# Patient Record
Sex: Female | Born: 1965 | Hispanic: Yes | Smoking: Never smoker
Health system: Southern US, Community
[De-identification: ages and names within clinical notes are randomized; demographics above are authoritative.]

## PROBLEM LIST (undated history)

## (undated) DIAGNOSIS — D219 Benign neoplasm of connective and other soft tissue, unspecified: Secondary | ICD-10-CM

## (undated) DIAGNOSIS — D649 Anemia, unspecified: Secondary | ICD-10-CM

## (undated) DIAGNOSIS — I10 Essential (primary) hypertension: Secondary | ICD-10-CM

## (undated) DIAGNOSIS — IMO0002 Reserved for concepts with insufficient information to code with codable children: Secondary | ICD-10-CM

## (undated) DIAGNOSIS — R87619 Unspecified abnormal cytological findings in specimens from cervix uteri: Secondary | ICD-10-CM

## (undated) HISTORY — PX: CRYOTHERAPY: SHX1416

## (undated) HISTORY — PX: TUBAL LIGATION: SHX77

## (undated) HISTORY — DX: Anemia, unspecified: D64.9

## (undated) HISTORY — DX: Essential (primary) hypertension: I10

## (undated) HISTORY — DX: Benign neoplasm of connective and other soft tissue, unspecified: D21.9

## (undated) HISTORY — DX: Unspecified abnormal cytological findings in specimens from cervix uteri: R87.619

## (undated) HISTORY — DX: Reserved for concepts with insufficient information to code with codable children: IMO0002

---

## 2008-05-25 HISTORY — PX: LAPAROSCOPY: SHX197

## 2009-01-23 ENCOUNTER — Ambulatory Visit: Payer: Self-pay | Admitting: Interventional Radiology

## 2009-01-23 ENCOUNTER — Emergency Department (HOSPITAL_BASED_OUTPATIENT_CLINIC_OR_DEPARTMENT_OTHER): Admission: EM | Admit: 2009-01-23 | Discharge: 2009-01-23 | Payer: Self-pay | Admitting: Emergency Medicine
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
## 2012-09-15 ENCOUNTER — Ambulatory Visit (INDEPENDENT_AMBULATORY_CARE_PROVIDER_SITE_OTHER): Payer: Commercial Indemnity | Admitting: Obstetrics & Gynecology

## 2012-09-15 ENCOUNTER — Encounter: Payer: Self-pay | Admitting: Obstetrics & Gynecology

## 2012-09-15 VITALS — BP 136/82 | HR 66 | Temp 97.7°F | Resp 16 | Ht 64.0 in | Wt 225.0 lb

## 2012-09-15 DIAGNOSIS — Z0000 Encounter for general adult medical examination without abnormal findings: Secondary | ICD-10-CM

## 2012-09-15 DIAGNOSIS — Z113 Encounter for screening for infections with a predominantly sexual mode of transmission: Secondary | ICD-10-CM

## 2012-09-15 DIAGNOSIS — Z01419 Encounter for gynecological examination (general) (routine) without abnormal findings: Secondary | ICD-10-CM

## 2012-09-15 DIAGNOSIS — Z1151 Encounter for screening for human papillomavirus (HPV): Secondary | ICD-10-CM

## 2012-09-15 DIAGNOSIS — Z124 Encounter for screening for malignant neoplasm of cervix: Secondary | ICD-10-CM

## 2012-09-15 DIAGNOSIS — N390 Urinary tract infection, site not specified: Secondary | ICD-10-CM

## 2012-09-15 LAB — POCT URINALYSIS DIPSTICK
Nitrite, UA: NEGATIVE
Protein, UA: NEGATIVE
Urobilinogen, UA: NEGATIVE
pH, UA: 6.5

## 2012-09-15 MED ORDER — FLAVOXATE HCL 100 MG PO TABS: 100.0000 mg | ORAL_TABLET | Freq: Three times a day (TID) | ORAL | Status: DC | PRN

## 2012-09-15 MED ORDER — SULFAMETHOXAZOLE-TRIMETHOPRIM 800-160 MG PO TABS: 1.0000 | ORAL_TABLET | Freq: Two times a day (BID) | ORAL | Status: DC

## 2012-09-15 NOTE — Progress Notes (Signed)
Subjective:    Caroline Pena is a 46 y.o. female who presents for an annual exam. She thinks that she has had a UTI for 2 days. The patient is sexually active. GYN screening history: last pap: was normal. The patient wears seatbelts: yes. The patient participates in regular exercise: no. Has the patient ever been transfused or tattooed?: no. The patient reports that there is not domestic violence in her life.   Menstrual History: OB History   Grav Para Term Preterm Abortions TAB SAB Ect Mult Living   2 2 2      1 3       Menarche age: 23  Patient's last menstrual period was 09/09/2012.    The following portions of the patient's history were reviewed and updated as appropriate: allergies, current medications, past family history, past medical history, past social history, past surgical history and problem list.  Review of Systems A comprehensive review of systems was negative.  She is a Production designer, theatre/television/film for [employer]. She has a boyfriend and denies dyspareunia. Her kids and grandchild live with her. Her mammogram is due 9/14.   Objective:    BP 136/82  Pulse 66  Temp(Src) 97.7 F (36.5 C) (Oral)  Resp 16  Ht 5\' 4"  (1.626 m)  Wt 225 lb (102.059 kg)  BMI 38.6 kg/m2  LMP 09/09/2012  General Appearance:    Alert, cooperative, no distress, appears stated age  Head:    Normocephalic, without obvious abnormality, atraumatic  Eyes:    PERRL, conjunctiva/corneas clear, EOM's intact, fundi    benign, both eyes  Ears:    Normal TM's and external ear canals, both ears  Nose:   Nares normal, septum midline, mucosa normal, no drainage    or sinus tenderness  Throat:   Lips, mucosa, and tongue normal; teeth and gums normal  Neck:   Supple, symmetrical, trachea midline, no adenopathy;    thyroid:  no enlargement/tenderness/nodules; no carotid   bruit or JVD  Back:     Symmetric, no curvature, ROM normal, no CVA tenderness  Lungs:     Clear to auscultation bilaterally, respirations unlabored   Chest Wall:    No tenderness or deformity   Heart:    Regular rate and rhythm, S1 and S2 normal, no murmur, rub   or gallop  Breast Exam:    No tenderness, masses, or nipple abnormality  Abdomen:     Soft, non-tender, bowel sounds active all four quadrants,    no masses, no organomegaly  Genitalia:    Normal female without lesion, discharge or tenderness, NSSA, NT, mobile, normal adnexal exam     Extremities:   Extremities normal, atraumatic, no cyanosis or edema  Pulses:   2+ and symmetric all extremities  Skin:   Skin color, texture, turgor normal, no rashes or lesions  Lymph nodes:   Cervical, supraclavicular, and axillary nodes normal  Neurologic:   CNII-XII intact, normal strength, sensation and reflexes    throughout  .    Assessment:    Healthy female exam.    Plan:     Chlamydia specimen. GC specimen. Thin prep Pap smear.  with HPV cotesting She will make an appt with Dr. Linford Arnold for primary care

## 2012-09-22 ENCOUNTER — Encounter: Payer: Self-pay | Admitting: Obstetrics & Gynecology

## 2012-09-23 ENCOUNTER — Ambulatory Visit: Payer: Commercial Indemnity | Admitting: Physician Assistant

## 2012-12-23 ENCOUNTER — Ambulatory Visit (INDEPENDENT_AMBULATORY_CARE_PROVIDER_SITE_OTHER): Payer: Commercial Indemnity | Admitting: Physician Assistant

## 2012-12-23 ENCOUNTER — Encounter: Payer: Self-pay | Admitting: Physician Assistant

## 2012-12-23 VITALS — BP 130/72 | HR 69 | Wt 231.0 lb

## 2012-12-23 DIAGNOSIS — K219 Gastro-esophageal reflux disease without esophagitis: Secondary | ICD-10-CM

## 2012-12-23 DIAGNOSIS — R209 Unspecified disturbances of skin sensation: Secondary | ICD-10-CM

## 2012-12-23 DIAGNOSIS — E669 Obesity, unspecified: Secondary | ICD-10-CM

## 2012-12-23 DIAGNOSIS — R202 Paresthesia of skin: Secondary | ICD-10-CM

## 2012-12-23 DIAGNOSIS — H6091 Unspecified otitis externa, right ear: Secondary | ICD-10-CM

## 2012-12-23 DIAGNOSIS — H60399 Other infective otitis externa, unspecified ear: Secondary | ICD-10-CM

## 2012-12-23 MED ORDER — NEOMYCIN-POLYMYXIN-HC 3.5-10000-1 OT SOLN: 3.0000 [drp] | Freq: Three times a day (TID) | OTIC | Status: AC

## 2012-12-23 MED ORDER — OMEPRAZOLE 40 MG PO CPDR: 40.0000 mg | DELAYED_RELEASE_CAPSULE | Freq: Every day | ORAL | Status: DC

## 2012-12-23 MED ORDER — CYCLOBENZAPRINE HCL 5 MG PO TABS: 5.0000 mg | ORAL_TABLET | Freq: Every evening | ORAL | Status: DC | PRN
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
## 2012-12-23 NOTE — Progress Notes (Signed)
  Subjective:    Patient ID: Caroline Pena, female    DOB: 1966-05-22, 47 y.o.   MRN: [id number]  HPI Patient presents to the clinic to establish care. Not currently on any medications and has no medical dx. She has not seen a doctor in many years. Denies smoking or drinking. She has some acute concerns today.  She has had 2 weeks of ear pain and watery discharge. Pain is throbbing and worse when she lays on it or touches it. Denies any swimming. No ST, fever, chills, sinus pressure, cough. She has had tonsils removed. She has not tried anything to make worse or better.   She is also concerned about her continual weight gain. She is never hungry and does not eat a lot. Denies any exercise. Concerned weight gain could be from something else metabolically like Thyroid.   She is also having some burning in chest. The other day it did radiate down left arm for a few minutes. Worse after fatty meals. Not tried anything to make better or worse.       Review of Systems  All other systems reviewed and are negative.       Objective:   Physical Exam  Constitutional: She is oriented to person, place, and time. She appears well-developed and well-nourished.  Obese.  HENT:  Head: Normocephalic and atraumatic.  Left Ear: External ear normal.  Nose: Nose normal.  Mouth/Throat: Oropharynx is clear and moist. No oropharyngeal exudate.  Right ear painful to touch tragus. Erythema in canal and around TM. No blood or pus.  Eyes: Conjunctivae are normal.  Neck: Normal range of motion. Neck supple.  Cardiovascular: Normal rate, regular rhythm and normal heart sounds.   Pulmonary/Chest: Effort normal and breath sounds normal. She has no wheezes.  Lymphadenopathy:    She has no cervical adenopathy.  Neurological: She is alert and oriented to person, place, and time.  Skin: Skin is warm and dry.  Psychiatric: She has a normal mood and affect. Her behavior is normal.          Assessment & Plan:    Right otitis externa- gave handout. Treated with polytrim for 7 days. Call if not improving.  Obesity- discussed nutrition referral. Pt declined today. Talked about small frequent meals to keep metabolism going. Talked about 1200 calorie diet with 150 minutes of exercise a week. Discussed belviq and pt would like to try. Will call when samples come in. Will order cbc and TSH.Follow up in 3 months.  Indigestion/left arm pain/numbness and tingling-certainly could be some reflux causing burning and left arm sensations.Jovita Gamma prilosec to try daily for next couple of weeks. Call if not improving or worsening. EKG- NSR, rate at 69, no acute ST elevation or depression, good r wave progression. Gave flexeril to use if needed if felt muscular pain.

## 2012-12-27 ENCOUNTER — Encounter: Payer: Self-pay | Admitting: *Deleted

## 2013-02-08 ENCOUNTER — Encounter: Payer: Self-pay | Admitting: Physician Assistant

## 2013-02-08 ENCOUNTER — Ambulatory Visit (INDEPENDENT_AMBULATORY_CARE_PROVIDER_SITE_OTHER): Payer: Commercial Indemnity | Admitting: Physician Assistant

## 2013-02-08 VITALS — BP 128/80 | HR 68 | Wt 235.0 lb

## 2013-02-08 DIAGNOSIS — Z0000 Encounter for general adult medical examination without abnormal findings: Secondary | ICD-10-CM

## 2013-02-08 DIAGNOSIS — Z23 Encounter for immunization: Secondary | ICD-10-CM

## 2013-02-08 DIAGNOSIS — K219 Gastro-esophageal reflux disease without esophagitis: Secondary | ICD-10-CM

## 2013-02-08 DIAGNOSIS — Z13220 Encounter for screening for lipoid disorders: Secondary | ICD-10-CM

## 2013-02-08 DIAGNOSIS — Z131 Encounter for screening for diabetes mellitus: Secondary | ICD-10-CM

## 2013-02-08 DIAGNOSIS — E669 Obesity, unspecified: Secondary | ICD-10-CM

## 2013-02-08 MED ORDER — PHENTERMINE HCL 37.5 MG PO CAPS: 37.5000 mg | ORAL_CAPSULE | ORAL | Status: DC

## 2013-02-08 NOTE — Progress Notes (Signed)
  Subjective:     Caroline Pena is a 47 y.o. female and is here for a comprehensive physical exam. The patient reports problems - weight gain. Marland Kitchen  Pt feels like weight is increasing and she has started to walk more and tried to make better diet choices. Would like something to help with weight loss. She tolerated phentermine at one point without side effects and very helpful.   Indigestion symptoms have continued. Pt has not been taking prilosec.    History   Social History  . Marital Status: Divorced    Spouse Name: N/A    Number of Children: N/A  . Years of Education: N/A   Occupational History  . forklift driver    Social History Main Topics  . Smoking status: Never Smoker   . Smokeless tobacco: Never Used  . Alcohol Use: No  . Drug Use: No  . Sexual Activity: Yes    Partners: Male   Other Topics Concern  . Not on file   Social History Narrative  . No narrative on file   Health Maintenance  Topic Date Due  . Tetanus/tdap  01/22/1986  . Influenza Vaccine  12/23/2012  . Pap Smear  09/16/2015    The following portions of the patient's history were reviewed and updated as appropriate: allergies, current medications, past family history, past medical history, past social history, past surgical history and problem list.  Review of Systems A comprehensive review of systems was negative.   Objective:    BP 128/80  Pulse 68  Wt 235 lb (106.595 kg)  BMI 40.32 kg/m2 General appearance: alert, cooperative and appears stated age Head: Normocephalic, without obvious abnormality, atraumatic Eyes: conjunctivae/corneas clear. PERRL, EOM's intact. Fundi benign. Ears: normal TM's and external ear canals both ears Nose: Nares normal. Septum midline. Mucosa normal. No drainage or sinus tenderness. Throat: lips, mucosa, and tongue normal; teeth and gums normal Neck: no adenopathy, no carotid bruit, no JVD, supple, symmetrical, trachea midline and thyroid not enlarged, symmetric,  no tenderness/mass/nodules Back: symmetric, no curvature. ROM normal. No CVA tenderness. Lungs: clear to auscultation bilaterally Heart: regular rate and rhythm, S1, S2 normal, no murmur, click, rub or gallop Abdomen: soft, non-tender; bowel sounds normal; no masses,  no organomegaly Extremities: extremities normal, atraumatic, no cyanosis or edema  Pulses: 2+ and symmetric Skin: Skin color, texture, turgor normal. No rashes or lesions Lymph nodes: Cervical, supraclavicular, and axillary nodes normal. Neurologic: Grossly normal    Assessment:    Healthy female exam.     Plan:     CPE-mammogram will be up-to-date on Friday of this week. Screening labs were ordered. Patient was given flu shot today. Recommended calcium and vitamin D supplements.  Weight/obesity- to get phentermine today for one month. Patient aware of side effects of insomnia, palpitations, chest pains. Patient is aware to contact office if she were to experience these. Discussed getting an app to count her calories such as my fitness pal or lose it. Goal for calorie intake should be round 1300-1500 calories a day. Patient encouraged to continue exercising daily.   Acid reflux-patient had not been taking Prilosec every day. Suggested taking Prilosec in the morning 30 minutes before breakfast. If acid reflux symptoms continue please followup. See After Visit Summary for Counseling Recommendations

## 2013-02-08 NOTE — Patient Instructions (Addendum)
Start prilosec daily in morning.  Lose it or My fitness pal. 1300-1500 calories a day.  Start phentermine once a day.  Continue exercises.  Keeping You Healthy  Get These Tests 1. Blood Pressure- Have your blood pressure checked once a year by your health care provider.  Normal blood pressure is 120/80. 2. Weight- Have your body mass index (BMI) calculated to screen for obesity.  BMI is measure of body fat based on height and weight.  You can also calculate your own BMI at https://www.west-esparza.com/. 3. Cholesterol- Have your cholesterol checked every 5 years starting at age 46 then yearly starting at age 46. 4. Chlamydia, HIV, and other sexually transmitted diseases- Get screened every year until age 46, then within three months of each new sexual provider. 5. Pap Smear- Every 1-3 years; discuss with your health care provider. 6. Mammogram- Every year starting at age 46  Take these medicines  Calcium with Vitamin D-Your body needs 1200 mg of Calcium each day and [phone] IU of Vitamin D daily.  Your body can only absorb 500 mg of Calcium at a time so Calcium must be taken in 2 or 3 divided doses throughout the day.  Multivitamin with folic acid- Once daily if it is possible for you to become pregnant.  Get these Immunizations  Gardasil-Series of three doses; prevents HPV related illness such as genital warts and cervical cancer.  Menactra-Single dose; prevents meningitis.  Tetanus shot- Every 10 years.  Flu shot-Every year.  Take these steps 1. Do not smoke-Your healthcare provider can help you quit.  For tips on how to quit go to www.smokefree.gov or call 1-800 QUITNOW. 2. Be physically active- Exercise 5 days a week for at least 30 minutes.  If you are not already physically active, start slow and gradually work up to 30 minutes of moderate physical activity.  Examples of moderate activity include walking briskly, dancing, swimming, bicycling, etc. 3. Breast Cancer- A self breast  exam every month is important for early detection of breast cancer.  For more information and instruction on self breast exams, ask your healthcare provider or [email or web address]. 4. Eat a healthy diet- Eat a variety of healthy foods such as fruits, vegetables, whole grains, low fat milk, low fat cheeses, yogurt, lean meats, poultry and fish, beans, nuts, tofu, etc.  For more information go to www. Thenutritionsource.org 5. Drink alcohol in moderation- Limit alcohol intake to one drink or less per day. Never drink and drive. 6. Depression- Your emotional health is as important as your physical health.  If you're feeling down or losing interest in things you normally enjoy please talk to your healthcare provider about being screened for depression. 7. Dental visit- Brush and floss your teeth twice daily; visit your dentist twice a year. 8. Eye doctor- Get an eye exam at least every 2 years. 9. Helmet use- Always wear a helmet when riding a bicycle, motorcycle, rollerblading or skateboarding. 10. Safe sex- If you may be exposed to sexually transmitted infections, use a condom. 11. Seat belts- Seat belts can save your live; always wear one. 12. Smoke/Carbon Monoxide detectors- These detectors need to be installed on the appropriate level of your home. Replace batteries at least once a year. 13. Skin cancer- When out in the sun please cover up and use sunscreen 15 SPF or higher. 14. Violence- If anyone is threatening or hurting you, please tell your healthcare provider.

## 2013-02-10 DIAGNOSIS — E669 Obesity, unspecified: Secondary | ICD-10-CM | POA: Insufficient documentation

## 2013-02-15 ENCOUNTER — Encounter: Payer: Self-pay | Admitting: *Deleted

## 2013-03-08 ENCOUNTER — Encounter: Payer: Self-pay | Admitting: Physician Assistant

## 2013-03-08 ENCOUNTER — Ambulatory Visit (INDEPENDENT_AMBULATORY_CARE_PROVIDER_SITE_OTHER): Payer: Commercial Indemnity | Admitting: Physician Assistant

## 2013-03-08 VITALS — BP 122/73 | HR 73 | Wt 222.0 lb

## 2013-03-08 DIAGNOSIS — E669 Obesity, unspecified: Secondary | ICD-10-CM

## 2013-03-08 DIAGNOSIS — R635 Abnormal weight gain: Secondary | ICD-10-CM

## 2013-03-08 MED ORDER — CALCIUM CARBONATE 1500 (600 CA) MG PO TABS: ORAL_TABLET | ORAL | Status: DC

## 2013-03-08 MED ORDER — PHENTERMINE HCL 37.5 MG PO TABS: 37.5000 mg | ORAL_TABLET | Freq: Every day | ORAL | Status: DC

## 2013-03-08 MED ORDER — PHENTERMINE HCL 37.5 MG PO CAPS: 37.5000 mg | ORAL_CAPSULE | ORAL | Status: DC

## 2013-03-08 NOTE — Progress Notes (Signed)
  Subjective:    Patient ID: Levon Hedger, female    DOB: July 23, 1965, 47 y.o.   MRN: [id number]  HPI Patient is a 47 yo female who presents to the clinic to follow up on starting phentermine. She is doing great on it. Denies any CP, palpitations, insomnia. She continues to cut out pepsi and a lot of carbs. She is exercising daily in the form of walking. She has lost 13 lbs in one month.    Review of Systems     Objective:   Physical Exam  Constitutional: She appears well-developed and well-nourished.  Obese.   HENT:  Head: Normocephalic and atraumatic.  Cardiovascular: Normal rate, regular rhythm and normal heart sounds.   Pulmonary/Chest: Effort normal and breath sounds normal. She has no wheezes.  Skin: Skin is warm and dry.  Psychiatric: She has a normal mood and affect. Her behavior is normal.          Assessment & Plan:  Abnormal weight gain/obesity- refilled phentermine for another month. Gave tablet via pharmacy cheaper. Continue to work on diet and exercise. Very excited about progress. Nurse visit in 1 month for weight check.

## 2013-04-05 ENCOUNTER — Encounter: Payer: Self-pay | Admitting: *Deleted

## 2013-04-05 ENCOUNTER — Ambulatory Visit: Payer: Commercial Indemnity | Admitting: Physician Assistant

## 2013-04-05 VITALS — BP 133/67 | HR 76 | Wt 220.0 lb

## 2013-04-05 DIAGNOSIS — E669 Obesity, unspecified: Secondary | ICD-10-CM

## 2013-04-05 DIAGNOSIS — R635 Abnormal weight gain: Secondary | ICD-10-CM

## 2013-04-05 MED ORDER — PHENTERMINE HCL 37.5 MG PO CAPS: 37.5000 mg | ORAL_CAPSULE | ORAL | Status: DC

## 2013-04-05 NOTE — Progress Notes (Signed)
  Subjective:    Patient ID: Caroline Pena, female    DOB: Mar 09, 1966, 47 y.o.   MRN: [id number] Pt here for weight/bp check.  She has lost 2lbs from the last visit.  Has no complaints of any side effects.  Donne Anon, CMA HPI    Review of Systems     Objective:   Physical Exam        Assessment & Plan:

## 2013-05-05 ENCOUNTER — Ambulatory Visit: Payer: Commercial Indemnity | Admitting: *Deleted

## 2013-05-08 ENCOUNTER — Ambulatory Visit (INDEPENDENT_AMBULATORY_CARE_PROVIDER_SITE_OTHER): Payer: Commercial Indemnity | Admitting: Physician Assistant

## 2013-05-08 ENCOUNTER — Encounter: Payer: Self-pay | Admitting: *Deleted

## 2013-05-08 VITALS — BP 140/83 | HR 88 | Wt 215.0 lb

## 2013-05-08 DIAGNOSIS — E669 Obesity, unspecified: Secondary | ICD-10-CM

## 2013-05-08 MED ORDER — PHENTERMINE HCL 37.5 MG PO CAPS: 37.5000 mg | ORAL_CAPSULE | ORAL | Status: DC

## 2013-05-08 NOTE — Progress Notes (Signed)
   Subjective:    Patient ID: Caroline Pena, female    DOB: 1965-11-07, 47 y.o.   MRN: [id number] Pt here for a 1 month weight & bp check for phentermine.  She is down 5 lbs.  Donne Anon, CMA HPI    Review of Systems     Objective:   Physical Exam        Assessment & Plan:  Refill for one more month. Tandy Gaw PA-C

## 2013-06-05 ENCOUNTER — Encounter: Payer: Self-pay | Admitting: Physician Assistant

## 2013-06-05 ENCOUNTER — Ambulatory Visit (INDEPENDENT_AMBULATORY_CARE_PROVIDER_SITE_OTHER): Payer: Commercial Indemnity | Admitting: Physician Assistant

## 2013-06-05 VITALS — BP 130/84 | HR 81 | Wt 218.0 lb

## 2013-06-05 DIAGNOSIS — E669 Obesity, unspecified: Secondary | ICD-10-CM

## 2013-06-05 DIAGNOSIS — Z6836 Body mass index (BMI) 36.0-36.9, adult: Secondary | ICD-10-CM

## 2013-06-05 MED ORDER — NALTREXONE-BUPROPION HCL ER 8-90 MG PO TB12
ORAL_TABLET | ORAL | Status: DC
Start: 2013-06-05 — End: 2013-07-26

## 2013-06-05 NOTE — Patient Instructions (Signed)
Start contrave. Follow up in 1 month.

## 2013-06-05 NOTE — Progress Notes (Signed)
   Subjective:    Patient ID: Caroline Pena, female    DOB: 12/29/1965, 48 y.o.   MRN: [id number]  HPI Pt presents to the clinic to follow up on phentermine. Pt denies any SE of insomnia, palpitations, CP. Pt has gained 3lbs this month. She admits to not exercising. She has tried to make better choices but has not completely eliminated unhealthy eating. She would like to continue with some type of weight loss program.    Review of Systems     Objective:   Physical Exam  Constitutional: She is oriented to person, place, and time. She appears well-developed and well-nourished.  HENT:  Head: Normocephalic and atraumatic.  Cardiovascular: Normal rate, regular rhythm and normal heart sounds.   Pulmonary/Chest: Effort normal and breath sounds normal.  Neurological: She is alert and oriented to person, place, and time.  Psychiatric: She has a normal mood and affect. Her behavior is normal.          Assessment & Plan:  Obeisity/BMI 36- stop phentermine. Discussed options of contrave or belviq. Pt chose contrave. Discussed exercise program that comes with contrave. Pt aware pill only as good as diet and exercise that go along with it. Encouraged healthy eating. Pt aware of how to take pill. SE of seziure, nausea were discussed. Follow up in 1 month.    Spent 30 minutes with patient and greater than 50 percent of visit spent counseling pt regarding weight loss.

## 2013-07-10 ENCOUNTER — Ambulatory Visit: Payer: Commercial Indemnity | Admitting: Physician Assistant

## 2013-07-26 ENCOUNTER — Ambulatory Visit (INDEPENDENT_AMBULATORY_CARE_PROVIDER_SITE_OTHER): Payer: Commercial Indemnity | Admitting: Physician Assistant

## 2013-07-26 ENCOUNTER — Encounter: Payer: Self-pay | Admitting: Physician Assistant

## 2013-07-26 VITALS — BP 138/85 | HR 92 | Wt 215.0 lb

## 2013-07-26 DIAGNOSIS — E669 Obesity, unspecified: Secondary | ICD-10-CM

## 2013-07-26 DIAGNOSIS — Z6836 Body mass index (BMI) 36.0-36.9, adult: Secondary | ICD-10-CM | POA: Insufficient documentation

## 2013-07-26 DIAGNOSIS — R635 Abnormal weight gain: Secondary | ICD-10-CM

## 2013-07-26 MED ORDER — PHENTERMINE HCL 37.5 MG PO CAPS: 37.5000 mg | ORAL_CAPSULE | ORAL | Status: DC

## 2013-07-26 NOTE — Progress Notes (Signed)
   Subjective:    Patient ID: Sydnee Cabal, female    DOB: 01/07/1966, 48 y.o.   MRN: [id number]  HPI Pt is a 48 yo women who presents to the clinic on weight issues. She has lost 3lbs since last month. She tried contrave but was 75 dollars and too expensive for her to keep up with. She also feel like it made her more sleepy. She has joined the gym and working out 3-4 times a week. She has loaded an app on her phone to log her calories. She would like to try phenterimine again.    Review of Systems     Objective:   Physical Exam  Constitutional: She is oriented to person, place, and time. She appears well-developed and well-nourished.  Obese.   HENT:  Head: Normocephalic and atraumatic.  Cardiovascular: Normal rate, regular rhythm and normal heart sounds.   Pulmonary/Chest: Effort normal and breath sounds normal. She has no wheezes.  Neurological: She is alert and oriented to person, place, and time.  Skin: Skin is warm and dry.  Psychiatric: She has a normal mood and affect. Her behavior is normal.          Assessment & Plan:  BMI 36/Obesity- I discussed that she could not stay on phentermine. Will try a month. If her BP goes up which is borderline today or not losing weight will take off. Continue to work on diet and exercise. Discussed adding wellburtin only. She would like to hold off on that as this time. At max will probably only keep on phentermine another 3 months pending weight loss and good vitals. Reminded pt of SE of phentermine.

## 2013-08-23 ENCOUNTER — Ambulatory Visit: Payer: Commercial Indemnity | Admitting: Physician Assistant

## 2013-11-28 ENCOUNTER — Ambulatory Visit: Payer: Commercial Indemnity | Admitting: Obstetrics & Gynecology

## 2013-12-05 ENCOUNTER — Ambulatory Visit: Payer: Commercial Indemnity | Admitting: Obstetrics & Gynecology

## 2014-01-04 ENCOUNTER — Encounter: Payer: Self-pay | Admitting: Obstetrics & Gynecology

## 2014-01-04 ENCOUNTER — Ambulatory Visit (INDEPENDENT_AMBULATORY_CARE_PROVIDER_SITE_OTHER): Payer: Commercial Indemnity | Admitting: Obstetrics & Gynecology

## 2014-01-04 VITALS — BP 141/77 | HR 80 | Resp 16 | Ht 64.0 in | Wt 218.0 lb

## 2014-01-04 DIAGNOSIS — Z0000 Encounter for general adult medical examination without abnormal findings: Secondary | ICD-10-CM

## 2014-01-04 DIAGNOSIS — Z01419 Encounter for gynecological examination (general) (routine) without abnormal findings: Secondary | ICD-10-CM

## 2014-01-04 DIAGNOSIS — Z1151 Encounter for screening for human papillomavirus (HPV): Secondary | ICD-10-CM

## 2014-01-04 DIAGNOSIS — Z124 Encounter for screening for malignant neoplasm of cervix: Secondary | ICD-10-CM

## 2014-01-04 NOTE — Progress Notes (Signed)
Subjective:    Caroline Pena is a 48 y.o. S H G2P3 (65yo daughter and 81 yo twin sons) female who presents for an annual exam. The patient has no complaints today. She reports a yeast infection after each period. She is taking amoxacillin now for a dental problem and says that it has "calmed down" the yeast infection. The patient is sexually active. GYN screening history: last pap: was normal. The patient wears seatbelts: yes. The patient participates in regular exercise: yes. Has the patient ever been transfused or tattooed?: no. The patient reports that there is not domestic violence in her life.   Menstrual History: OB History   Grav Para Term Preterm Abortions TAB SAB Ect Mult Living   2 2 2      1 3       Menarche age: 63  Patient's last menstrual period was 12/23/2013.    The following portions of the patient's history were reviewed and updated as appropriate: allergies, current medications, past family history, past medical history, past social history, past surgical history and problem list.  Review of Systems A comprehensive review of systems was negative. Works at [employer] and they do her annual mammogram there. She has a FP and will get her lipids checked next month. Monogamous for 4 1/2 years. She uses a BTL for contraception.   Objective:    BP 141/77  Pulse 80  Resp 16  Ht 5\' 4"  (1.626 m)  Wt 218 lb (98.884 kg)  BMI 37.40 kg/m2  LMP 12/23/2013  General Appearance:    Alert, cooperative, no distress, appears stated age  Head:    Normocephalic, without obvious abnormality, atraumatic  Eyes:    PERRL, conjunctiva/corneas clear, EOM's intact, fundi    benign, both eyes  Ears:    Normal TM's and external ear canals, both ears  Nose:   Nares normal, septum midline, mucosa normal, no drainage    or sinus tenderness  Throat:   Lips, mucosa, and tongue normal; teeth and gums normal  Neck:   Supple, symmetrical, trachea midline, no adenopathy;    thyroid:  no  enlargement/tenderness/nodules; no carotid   bruit or JVD  Back:     Symmetric, no curvature, ROM normal, no CVA tenderness  Lungs:     Clear to auscultation bilaterally, respirations unlabored  Chest Wall:    No tenderness or deformity   Heart:    Regular rate and rhythm, S1 and S2 normal, no murmur, rub   or gallop  Breast Exam:    No tenderness, masses, or nipple abnormality  Abdomen:     Soft, non-tender, bowel sounds active all four quadrants,    no masses, no organomegaly  Genitalia:    Normal female without lesion, discharge or tenderness, ULN size, NT, mobile, normal adnexal exam     Extremities:   Extremities normal, atraumatic, no cyanosis or edema  Pulses:   2+ and symmetric all extremities  Skin:   Skin color, texture, turgor normal, no rashes or lesions  Lymph nodes:   Cervical, supraclavicular, and axillary nodes normal  Neurologic:   CNII-XII intact, normal strength, sensation and reflexes    throughout  .    Assessment:    Healthy female exam.  Recurrent yeast infection symptoms   Plan:     Breast self exam technique reviewed and patient encouraged to perform self-exam monthly. Thin prep Pap smear. with cotesting I have recommended that she come in when she has her symptoms

## 2014-01-09 LAB — CYTOLOGY - PAP

## 2014-03-26 ENCOUNTER — Encounter: Payer: Self-pay | Admitting: Obstetrics & Gynecology

## 2014-04-03 ENCOUNTER — Ambulatory Visit: Payer: Commercial Indemnity | Admitting: Obstetrics & Gynecology

## 2014-04-24 ENCOUNTER — Encounter: Payer: Self-pay | Admitting: Physician Assistant

## 2014-04-24 ENCOUNTER — Ambulatory Visit (INDEPENDENT_AMBULATORY_CARE_PROVIDER_SITE_OTHER): Payer: Commercial Indemnity | Admitting: Physician Assistant

## 2014-04-24 VITALS — BP 148/85 | HR 79 | Resp 16 | Ht 65.0 in | Wt 226.0 lb

## 2014-04-24 DIAGNOSIS — N760 Acute vaginitis: Secondary | ICD-10-CM

## 2014-04-24 DIAGNOSIS — N898 Other specified noninflammatory disorders of vagina: Secondary | ICD-10-CM

## 2014-04-24 DIAGNOSIS — B9689 Other specified bacterial agents as the cause of diseases classified elsewhere: Secondary | ICD-10-CM | POA: Insufficient documentation

## 2014-04-24 DIAGNOSIS — A499 Bacterial infection, unspecified: Secondary | ICD-10-CM

## 2014-04-24 MED ORDER — METRONIDAZOLE 500 MG PO TABS: 500.0000 mg | ORAL_TABLET | Freq: Two times a day (BID) | ORAL | Status: DC

## 2014-04-24 NOTE — Patient Instructions (Signed)
Bacterial Vaginosis Bacterial vaginosis is a vaginal infection that occurs when the normal balance of bacteria in the vagina is disrupted. It results from an overgrowth of certain bacteria. This is the most common vaginal infection in women of childbearing age. Treatment is important to prevent complications, especially in pregnant women, as it can cause a premature delivery. CAUSES  Bacterial vaginosis is caused by an increase in harmful bacteria that are normally present in smaller amounts in the vagina. Several different kinds of bacteria can cause bacterial vaginosis. However, the reason that the condition develops is not fully understood. RISK FACTORS Certain activities or behaviors can put you at an increased risk of developing bacterial vaginosis, including:  Having a new sex partner or multiple sex partners.  Douching.  Using an intrauterine device (IUD) for contraception. Women do not get bacterial vaginosis from toilet seats, bedding, swimming pools, or contact with objects around them. SIGNS AND SYMPTOMS  Some women with bacterial vaginosis have no signs or symptoms. Common symptoms include:  Grey vaginal discharge.  A fishlike odor with discharge, especially after sexual intercourse.  Itching or burning of the vagina and vulva.  Burning or pain with urination. DIAGNOSIS  Your health care provider will take a medical history and examine the vagina for signs of bacterial vaginosis. A sample of vaginal fluid may be taken. Your health care provider will look at this sample under a microscope to check for bacteria and abnormal cells. A vaginal pH test may also be done.  TREATMENT  Bacterial vaginosis may be treated with antibiotic medicines. These may be given in the form of a pill or a vaginal cream. A second round of antibiotics may be prescribed if the condition comes back after treatment.  HOME CARE INSTRUCTIONS   Only take over-the-counter or prescription medicines as  directed by your health care provider.  If antibiotic medicine was prescribed, take it as directed. Make sure you finish it even if you start to feel better.  Do not have sex until treatment is completed.  Tell all sexual partners that you have a vaginal infection. They should see their health care provider and be treated if they have problems, such as a mild rash or itching.  Practice safe sex by using condoms and only having one sex partner. SEEK MEDICAL CARE IF:   Your symptoms are not improving after 3 days of treatment.  You have increased discharge or pain.  You have a fever. MAKE SURE YOU:   Understand these instructions.  Will watch your condition.  Will get help right away if you are not doing well or get worse. FOR MORE INFORMATION  Centers for Disease Control and Prevention, Division of STD Prevention: www.cdc.gov/std American Sexual Health Association (ASHA): www.ashastd.org  Document Released: 05/11/2005 Document Revised: 03/01/2013 Document Reviewed: 12/21/2012 ExitCare Patient Information 2015 [employer]. This information is not intended to replace advice given to you by your health care provider. Make sure you discuss any questions you have with your health care provider.  

## 2014-04-24 NOTE — Progress Notes (Signed)
Patient ID: Caroline Pena, female   DOB: 04-27-1966, 48 y.o.   MRN: [id number] History:  Caroline Pena is a 48 y.o. G2P2003 who presents to clinic today for complaint of pelvic discomfort with vaginal discharge.  She has had these symptoms for a week.  The discharge is runny and yellow with green. She denies fever, abominal pain, nausea, vomiting, diarrhea, constipation.  LMP was 11/20.  She had a BTL foro contraception.   The following portions of the patient's history were reviewed and updated as appropriate: allergies, current medications, past family history, past medical history, past social history, past surgical history and problem list.  Review of Systems:  Pertinent items are noted in HPI.  Objective:  Physical Exam BP 148/85 mmHg  Pulse 79  Resp 16  Ht 5\' 5"  (1.651 m)  Wt 226 lb (102.513 kg)  BMI 37.61 kg/m2  LMP 04/17/2014 GENERAL: Well-developed, well-nourished female in no acute distress.  HEENT: Normocephalic, atraumatic.  HEART: Regular rate  ABDOMEN: Soft, nontender, nondistended.  PELVIC: Normal external female genitalia. Vagina is pink and rugated.  Moderate amt of white homogenous malodorous discharge. Normal cervix contour.No adnexal mass or tenderness.  EXTREMITIES: No cyanosis, clubbing, or edema, 2+ distal pulses.   Labs and Imaging Affirm pending  Assessment & Plan:  Assessment: Bacterial Vaginosis  Plans: Flagyl x 1 week No etoh/IC on meds RTC if no improvement RTC for annual exam  Paticia Stack, PA-C 04/24/2014 3:27 PM

## 2014-04-25 ENCOUNTER — Telehealth: Payer: Self-pay | Admitting: *Deleted

## 2014-04-25 LAB — WET PREP BY MOLECULAR PROBE
CANDIDA SPECIES: NEGATIVE
Gardnerella vaginalis: POSITIVE — AB
TRICHOMONAS VAG: NEGATIVE

## 2014-04-25 NOTE — Telephone Encounter (Signed)
Called pt to adv positive BV and meds at pharm. LMOM for pt to rtn call if needed.

## 2014-10-18 ENCOUNTER — Ambulatory Visit: Payer: Commercial Indemnity | Admitting: Obstetrics & Gynecology

## 2015-11-28 ENCOUNTER — Ambulatory Visit: Payer: Commercial Indemnity | Admitting: Obstetrics & Gynecology

## 2015-12-12 ENCOUNTER — Ambulatory Visit (INDEPENDENT_AMBULATORY_CARE_PROVIDER_SITE_OTHER): Payer: Managed Care, Other (non HMO) | Admitting: Obstetrics & Gynecology

## 2015-12-12 ENCOUNTER — Encounter: Payer: Self-pay | Admitting: Obstetrics & Gynecology

## 2015-12-12 VITALS — BP 140/81 | HR 81 | Resp 16 | Ht 64.0 in | Wt 221.0 lb

## 2015-12-12 DIAGNOSIS — B372 Candidiasis of skin and nail: Secondary | ICD-10-CM | POA: Diagnosis not present

## 2015-12-12 DIAGNOSIS — Z01419 Encounter for gynecological examination (general) (routine) without abnormal findings: Secondary | ICD-10-CM | POA: Diagnosis not present

## 2015-12-12 DIAGNOSIS — B373 Candidiasis of vulva and vagina: Secondary | ICD-10-CM

## 2015-12-12 DIAGNOSIS — B3731 Acute candidiasis of vulva and vagina: Secondary | ICD-10-CM

## 2015-12-12 MED ORDER — NYSTATIN 100000 UNIT/GM EX POWD: Freq: Four times a day (QID) | CUTANEOUS | Status: DC

## 2015-12-12 MED ORDER — FLUCONAZOLE 150 MG PO TABS: 150.0000 mg | ORAL_TABLET | Freq: Once | ORAL | Status: DC

## 2015-12-12 MED ORDER — TRIAMCINOLONE ACETONIDE 0.5 % EX OINT: 1.0000 "application " | TOPICAL_OINTMENT | Freq: Two times a day (BID) | CUTANEOUS | Status: DC

## 2015-12-13 NOTE — Progress Notes (Signed)
  Subjective:     Caroline Pena is a 50 y.o. female here for a routine exam.  Current complaints: itching and redness in introitus.  Pt has ben treated with diflucan and a yeast cream.  It got somewhat better, but still itches.  Labia majora also itches some.  Pt also starting to have hot flashes and flushes.  She has skipped two preiods this year--March and May.  Her periods are light after her ablation 9 years ago.     Gynecologic History Patient's last menstrual period was 10/10/2015. Contraception: tubal ligation Last Pap: 2015--nml cotesting.  Last mammogram: 2106. Results were: nml per patient--has done on mobile van at work.  Need recors.  Obstetric History OB History  Gravida Para Term Preterm AB SAB TAB Ectopic Multiple Living  2 2 2      1 3     # Outcome Date GA Lbr Len/2nd Weight Sex Delivery Anes PTL Lv  2 Term 04/17/92     CS-Unspec        Comments: twins  1 Term 07/08/90     Vag-Spont          The following portions of the patient's history were reviewed and updated as appropriate: allergies, current medications, past family history, past medical history, past social history, past surgical history and problem list.  Review of Systems Pertinent items noted in HPI and remainder of comprehensive ROS otherwise negative.    Objective:      Filed Vitals:   12/12/15 1448  BP: 140/81  Pulse: 81  Resp: 16  Height: 5\' 4"  (1.626 m)  Weight: 221 lb (100.245 kg)   Vitals:  WNL General appearance: alert, cooperative and no distress  HEENT: Normocephalic, without obvious abnormality, atraumatic Eyes: negative Throat: lips, mucosa, and tongue normal; teeth and gums normal  Respiratory: Clear to auscultation bilaterally  CV: Regular rate and rhythm  Breasts:  Normal appearance, no masses or tenderness, no nipple retraction or dimpling  GI: Soft, non-tender; bowel sounds normal; no masses,  no organomegaly  GU: External Genitalia:  Tanner V, labia majora is red and  evidence of excoriation, inguinal region also red. Urethra:  No prolapse   Vagina: Pink, normal rugae, no blood or discharge  Cervix: No CMT, no lesion  Uterus:  Normal size and contour, non tender  Adnexa: Normal, no masses, non tender  Musculoskeletal: No edema, redness or tenderness in the calves or thighs  Skin: No lesions or rash  Lymphatic: Axillary adenopathy: none    Psychiatric: Normal mood and behavior    Assessment:    Healthy female exam.    cutaneous yeast    Plan:   Pap with co-testing in 2018 mammogram (need copy of report) Diflucan x1 triamcinolone Nystatin powder Vulvar hygiene reviewed Anticipatory guidance around perimenopausal menstrual cycles.

## 2017-09-07 ENCOUNTER — Ambulatory Visit (INDEPENDENT_AMBULATORY_CARE_PROVIDER_SITE_OTHER): Payer: Managed Care, Other (non HMO) | Admitting: Obstetrics & Gynecology

## 2017-09-07 ENCOUNTER — Encounter: Payer: Self-pay | Admitting: Obstetrics & Gynecology

## 2017-09-07 VITALS — BP 116/70 | HR 77 | Resp 16 | Ht 64.0 in | Wt 228.0 lb

## 2017-09-07 DIAGNOSIS — Z23 Encounter for immunization: Secondary | ICD-10-CM | POA: Diagnosis not present

## 2017-09-07 DIAGNOSIS — Z124 Encounter for screening for malignant neoplasm of cervix: Secondary | ICD-10-CM

## 2017-09-07 DIAGNOSIS — Z01419 Encounter for gynecological examination (general) (routine) without abnormal findings: Secondary | ICD-10-CM

## 2017-09-07 DIAGNOSIS — Z1151 Encounter for screening for human papillomavirus (HPV): Secondary | ICD-10-CM | POA: Diagnosis not present

## 2017-09-07 NOTE — Progress Notes (Signed)
Subjective:    Caroline Pena is a 52 y.o. divorced P3 (1 grandson) female who presents for an annual exam.She reports irregular periods. She will skip a couple of months, hot flashes, night sweats, difficulty sleeping.  The patient is not currently sexually active, for a year, has an understanding partner. GYN screening history: last pap: was normal. The patient wears seatbelts: yes. The patient participates in regular exercise: yes. Has the patient ever been transfused or tattooed?: no. The patient reports that there is not domestic violence in her life.   Menstrual History: OB History    Gravida  2   Para  2   Term  2   Preterm      AB      Living  3     SAB      TAB      Ectopic      Multiple  1   Live Births              Menarche age: 52 Patient's last menstrual period was 08/17/2017.    The following portions of the patient's history were reviewed and updated as appropriate: allergies, current medications, past family history, past medical history, past social history, past surgical history and problem list.  Review of Systems Pertinent items are noted in HPI.   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)   Objective:    BP 116/70   Pulse 77   Resp 16   Ht 5\' 4"  (1.626 m)   Wt 228 lb (103.4 kg)   LMP 08/17/2017   BMI 39.14 kg/m   General Appearance:    Alert, cooperative, no distress, appears stated age  Head:    Normocephalic, without obvious abnormality, atraumatic  Eyes:    PERRL, conjunctiva/corneas clear, EOM's intact, fundi    benign, both eyes  Ears:    Normal TM's and external ear canals, both ears  Nose:   Nares normal, septum midline, mucosa normal, no drainage    or sinus tenderness  Throat:   Lips, mucosa, and tongue normal; teeth and gums normal  Neck:   Supple, symmetrical, trachea midline, no adenopathy;    thyroid:  no  enlargement/tenderness/nodules; no carotid   bruit or JVD  Back:     Symmetric, no curvature, ROM normal, no CVA tenderness  Lungs:     Clear to auscultation bilaterally, respirations unlabored  Chest Wall:    No tenderness or deformity   Heart:    Regular rate and rhythm, S1 and S2 normal, no murmur, rub   or gallop  Breast Exam:    No tenderness, masses, or nipple abnormality  Abdomen:     Soft, non-tender, bowel sounds active all four quadrants,    no masses, no organomegaly, morbidly obese  Genitalia:    Normal female without lesion, discharge or tenderness, normal size and shape, anteverted, mobile, non-tender, normal adnexal exam      Extremities:   Extremities normal, atraumatic, no cyanosis or edema  Pulses:   2+ and symmetric all extremities  Skin:   Skin color, texture, turgor normal, no rashes or lesions  Lymph nodes:   Cervical, supraclavicular, and axillary nodes normal  Neurologic:   CNII-XII intact, normal strength, sensation and reflexes    throughout  .    Assessment:    Healthy female exam.   perimenopause   Plan:  Thin prep Pap smear. with cotesting HIV Rec colon cancer screening Reassurance given regarding perimenopause symptoms

## 2017-09-09 LAB — CYTOLOGY - PAP
DIAGNOSIS: NEGATIVE
HPV (WINDOPATH): NOT DETECTED

## 2017-09-09 LAB — TSH: TSH: 0.93 m[IU]/L

## 2017-09-09 LAB — HIV ANTIBODY (ROUTINE TESTING W REFLEX): HIV: NONREACTIVE

## 2017-09-15 ENCOUNTER — Telehealth: Payer: Self-pay | Admitting: *Deleted

## 2017-09-15 NOTE — Telephone Encounter (Signed)
Pt called requesting her latest test results.  LM on her voicemail that all labs were neg.
